# Patient Record
Sex: Male | Born: 1985 | Hispanic: No | Marital: Single
Health system: Southern US, Community
[De-identification: ages and names within clinical notes are randomized; demographics above are authoritative.]

---
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
## 2006-12-01 ENCOUNTER — Ambulatory Visit: Payer: Self-pay | Admitting: Family Medicine

## 2008-01-09 IMAGING — US US PELVIS LIMITED
1 series · 17 of 25 positions shown · non-contrast
Comparison: none

REASON FOR EXAM: Left Nodule
COMMENTS:

[Series 1: us pelvis limited · 17 of 62 slices shown]
[im 1/62]
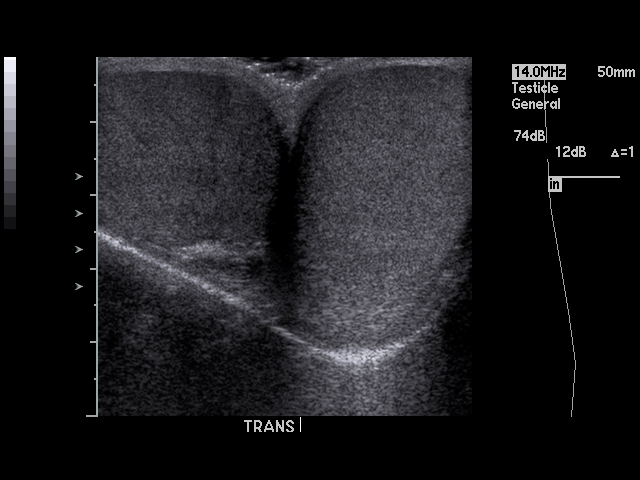
[im 6/62]
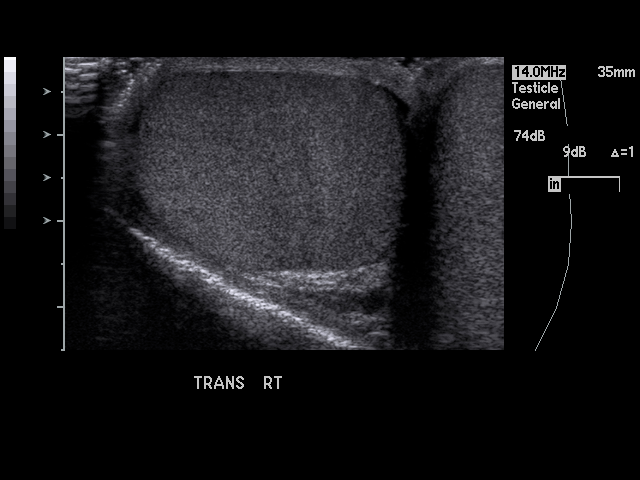
[im 8/62]
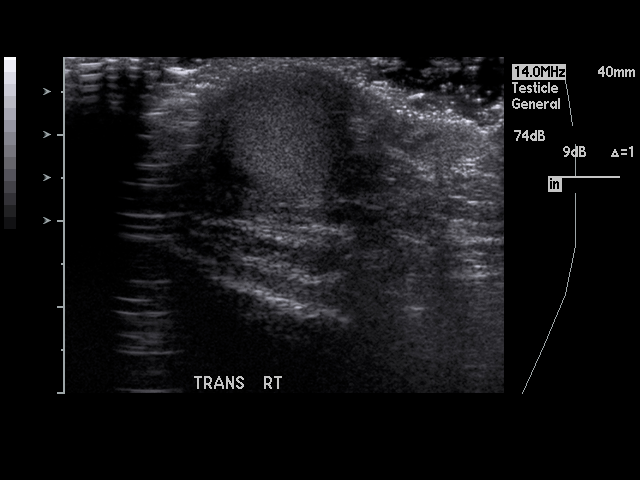
[im 13/62]
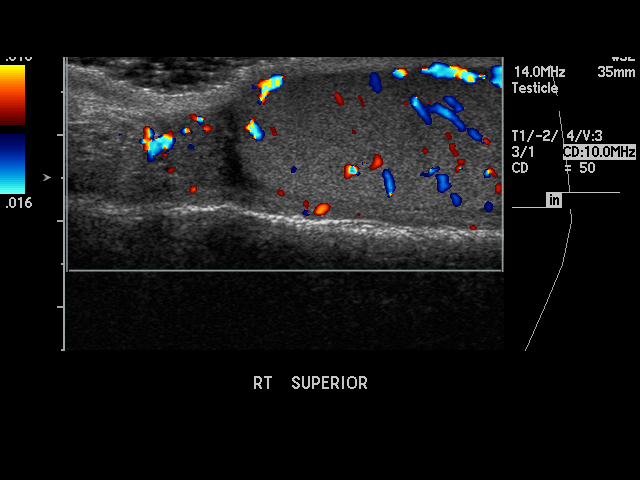
[im 16/62]
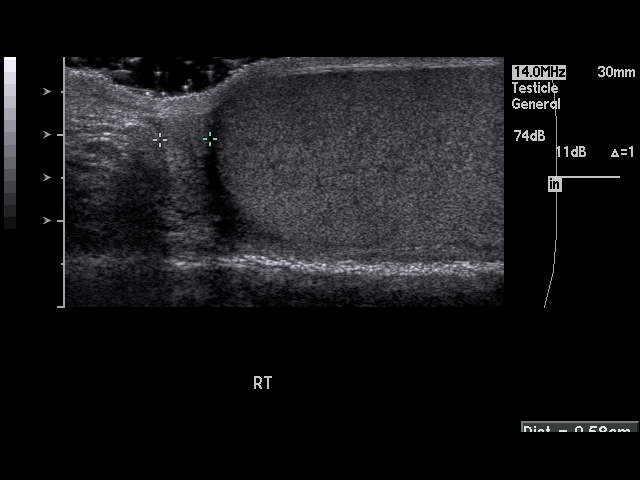
[im 21/62]
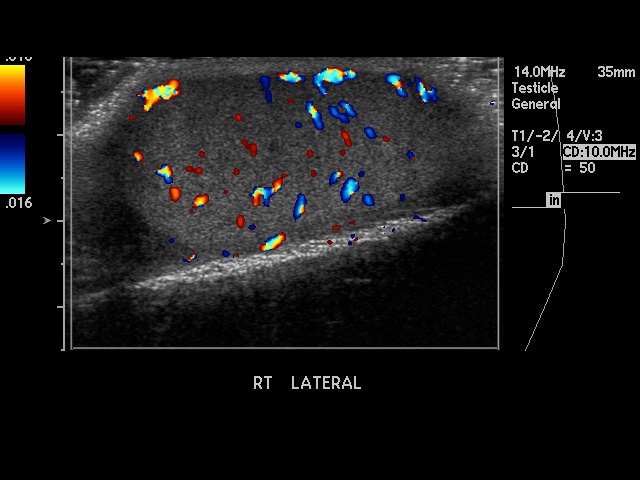
[im 23/62]
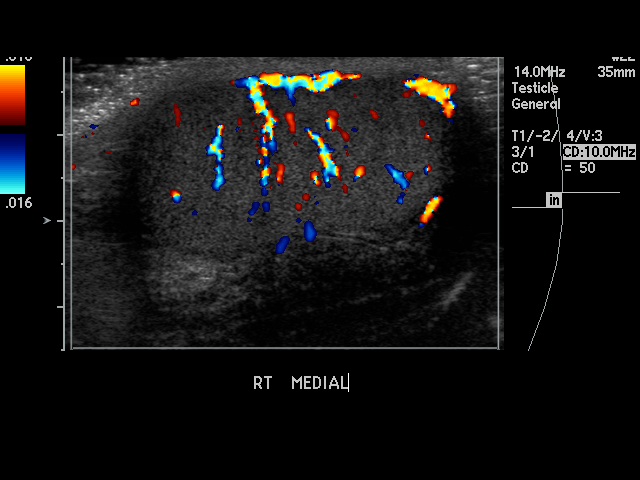
[im 28/62]
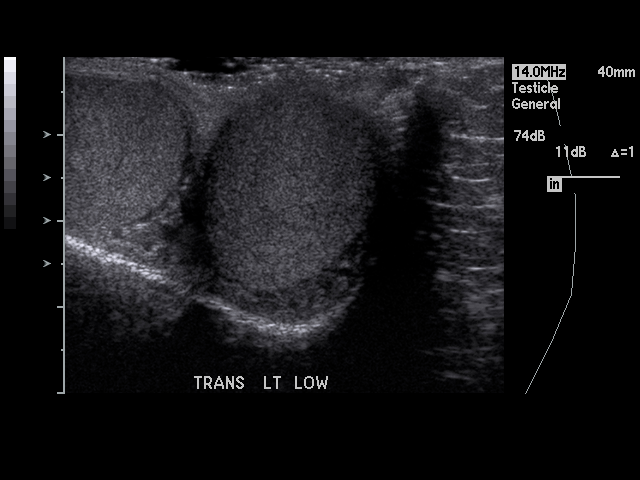
[im 31/62]
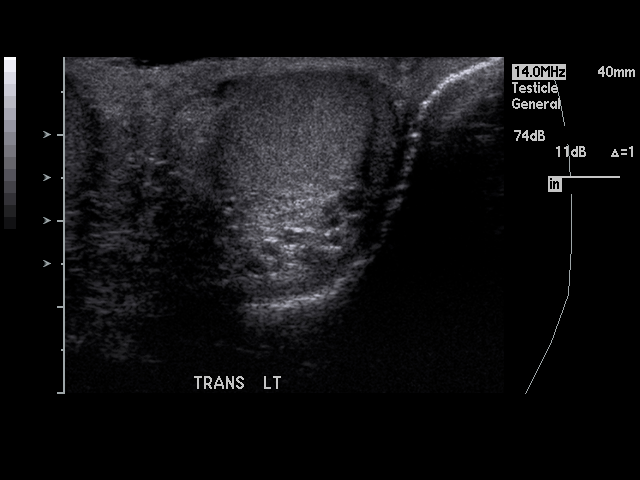
[im 34/62]
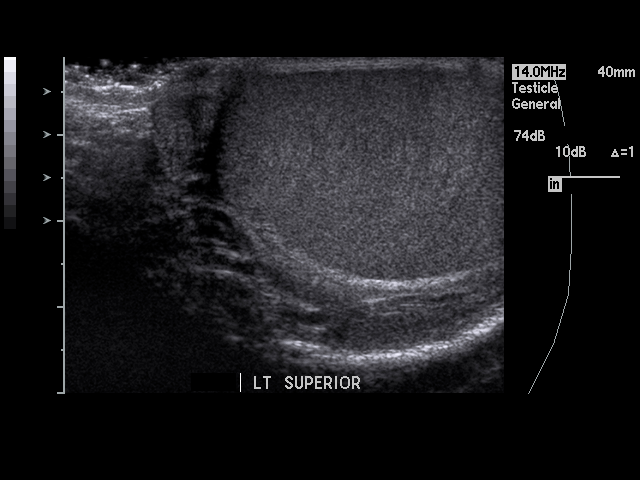
[im 39/62]
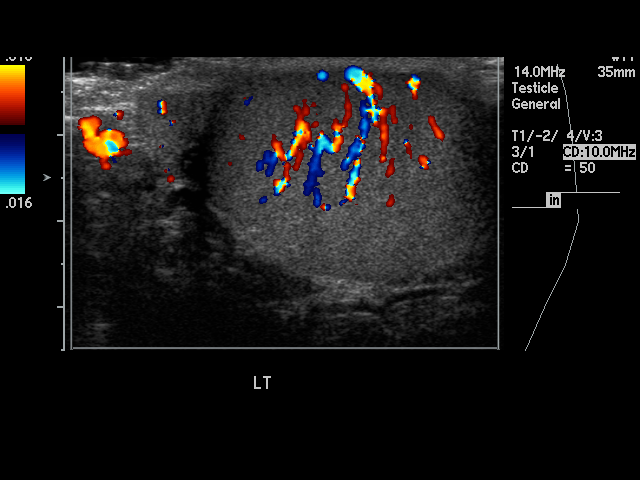
[im 41/62]
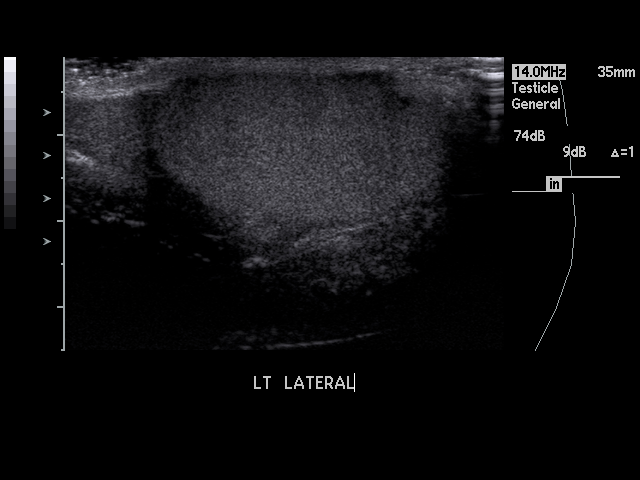
[im 46/62]
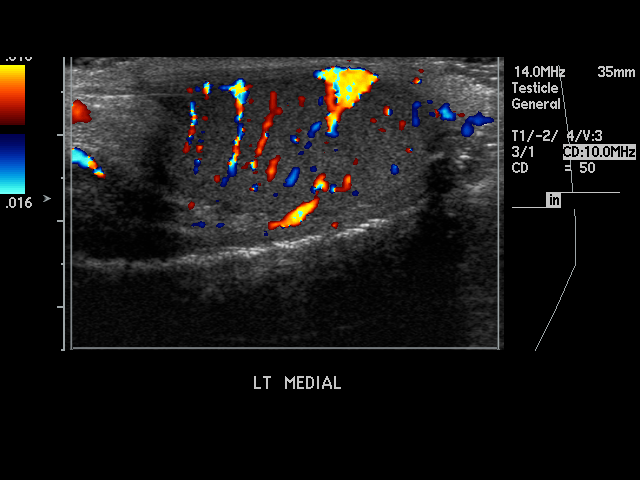
[im 49/62]
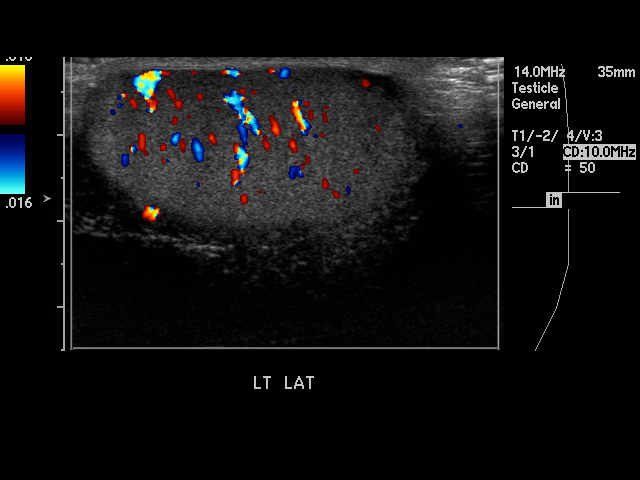
[im 54/62]
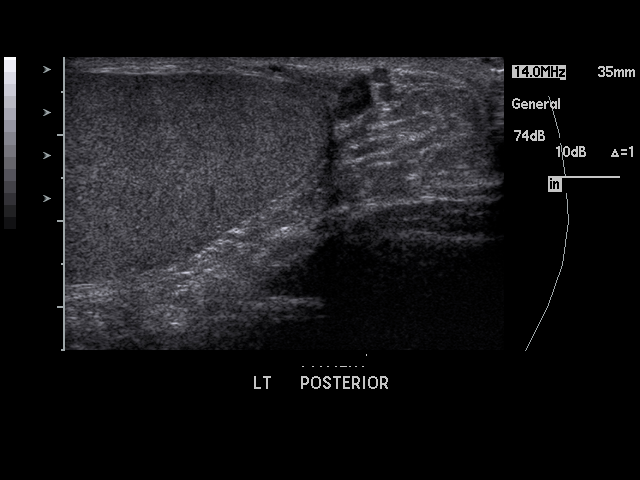
[im 56/62]
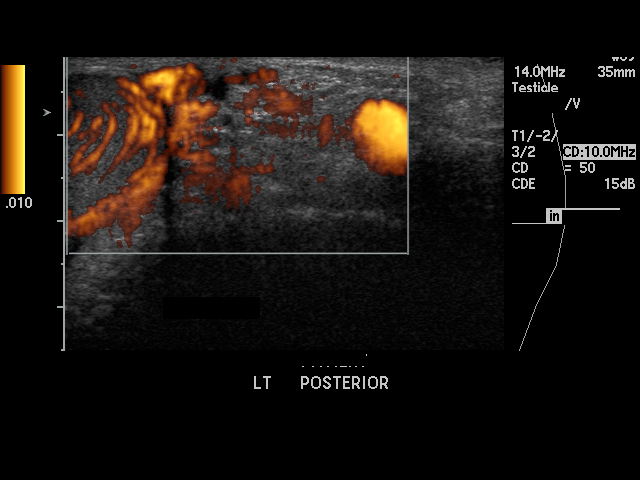
[im 62/62]
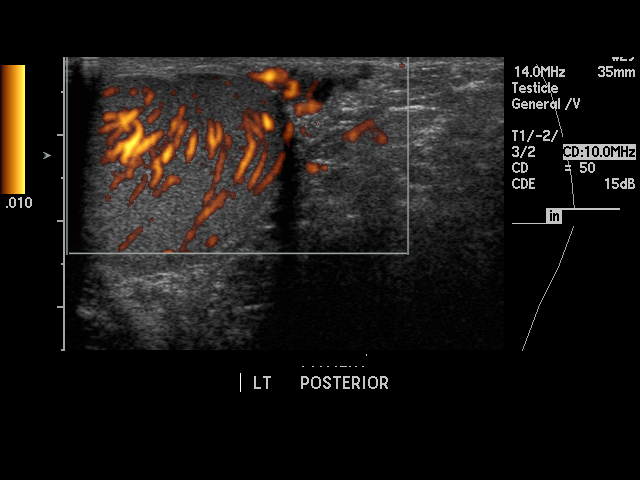

[17 of 25 positions shown; findings below may reference images not displayed]

PROCEDURE:     US  - US TESTICULAR  - December 01, 2006  [DATE]

RESULT:     The RIGHT testicle measures 5.16 cm x 2.40 cm x 3.0 cm. The LEFT
testicle measures 4.76 cm x 2.62 cm x 2.63 cm. No solid testicular mass
lesions are seen. There is noted a 2.9 mm cyst posteriorly in the LEFT
testicle. Doppler examination shows symmetrical vascular flow in each
testicle. The epididymis bilaterally is normal in appearance. No hydrocele
formation is observed.
IMPRESSION: No significant abnormalities are noted.

## 2012-10-08 ENCOUNTER — Ambulatory Visit: Payer: Self-pay | Admitting: Otolaryngology

## 2019-07-29 ENCOUNTER — Ambulatory Visit: Payer: Self-pay | Attending: Internal Medicine

## 2019-07-29 DIAGNOSIS — Z23 Encounter for immunization: Secondary | ICD-10-CM

## 2019-07-29 NOTE — Progress Notes (Signed)
   Covid-19 Vaccination [facility]  Name:  Raymond Price    MRN: [id number] DOB: 1985-08-11  07/29/2019  Mr. Troop was observed post Covid-19 immunization for 15 minutes without incident. He was provided with Vaccine Information Sheet and instruction to access the V-Safe system.   Mr. Tsang was instructed to call 911 with any severe reactions post vaccine: Marland Kitchen Difficulty breathing  . Swelling of face and throat  . A fast heartbeat  . A bad rash all over body  . Dizziness and weakness   Immunizations Administered    Name Date Dose VIS Date Route   Pfizer COVID-19 Vaccine 07/29/2019  2:11 PM 0.3 mL 04/23/2019 Intramuscular   Manufacturer: [employer], [employer]   Lot: [id number]   NDC: [address]-2

## 2019-08-23 ENCOUNTER — Ambulatory Visit: Payer: Medicaid Other | Attending: Internal Medicine

## 2019-08-23 DIAGNOSIS — Z23 Encounter for immunization: Secondary | ICD-10-CM

## 2019-08-23 NOTE — Progress Notes (Signed)
   Covid-19 Vaccination Clinic  Name:  [name]    MRN: [id number] DOB: 12-20-1985  08/23/2019  [name] was observed post Covid-19 immunization for 15 minutes without incident. He was provided with Vaccine Information Sheet and instruction to access the V-Safe system.   [name] was instructed to call 911 with any severe reactions post vaccine: Marland Kitchen Difficulty breathing  . Swelling of face and throat  . A fast heartbeat  . A bad rash all over body  . Dizziness and weakness   Immunizations Administered    Name Date Dose VIS Date Route   Pfizer COVID-19 Vaccine 08/23/2019  2:50 PM 0.3 mL 04/23/2019 Intramuscular   Manufacturer: [employer], [employer]   Lot: [id number]   NDC: [address]-2

## 2019-10-06 ENCOUNTER — Other Ambulatory Visit
Admission: RE | Admit: 2019-10-06 | Discharge: 2019-10-06 | Disposition: A | Discharge status: Home / Self Care | Payer: PRIVATE HEALTH INSURANCE | Source: Ambulatory Visit | Attending: Student | Admitting: Student

## 2019-10-06 DIAGNOSIS — M545 Low back pain: Secondary | ICD-10-CM | POA: Insufficient documentation

## 2019-10-06 DIAGNOSIS — R079 Chest pain, unspecified: Secondary | ICD-10-CM | POA: Insufficient documentation

## 2019-10-06 DIAGNOSIS — R0600 Dyspnea, unspecified: Secondary | ICD-10-CM | POA: Diagnosis present

## 2019-10-06 LAB — FIBRIN DERIVATIVES D-DIMER (ARMC ONLY): Fibrin derivatives D-dimer (ARMC): 116.09 ng/mL (FEU) (ref 0.00–499.00)

## 2021-05-23 ENCOUNTER — Other Ambulatory Visit: Payer: Self-pay | Admitting: Gastroenterology

## 2021-05-23 DIAGNOSIS — R1011 Right upper quadrant pain: Secondary | ICD-10-CM

## 2021-05-31 ENCOUNTER — Ambulatory Visit
Admission: RE | Admit: 2021-05-31 | Discharge: 2021-05-31 | Disposition: A | Discharge status: Home / Self Care | Payer: No Typology Code available for payment source | Source: Ambulatory Visit | Attending: Gastroenterology | Admitting: Gastroenterology

## 2021-05-31 ENCOUNTER — Other Ambulatory Visit: Payer: Self-pay

## 2021-05-31 DIAGNOSIS — R1011 Right upper quadrant pain: Secondary | ICD-10-CM | POA: Insufficient documentation

## 2021-06-14 ENCOUNTER — Other Ambulatory Visit: Payer: Self-pay | Admitting: Specialist

## 2021-06-14 DIAGNOSIS — R0609 Other forms of dyspnea: Secondary | ICD-10-CM

## 2021-06-14 DIAGNOSIS — R062 Wheezing: Secondary | ICD-10-CM

## 2021-06-26 ENCOUNTER — Ambulatory Visit
Admission: RE | Admit: 2021-06-26 | Discharge: 2021-06-26 | Disposition: A | Discharge status: Home / Self Care | Payer: No Typology Code available for payment source | Source: Ambulatory Visit | Attending: Specialist | Admitting: Specialist

## 2021-06-26 DIAGNOSIS — R062 Wheezing: Secondary | ICD-10-CM | POA: Insufficient documentation

## 2021-06-26 DIAGNOSIS — R0609 Other forms of dyspnea: Secondary | ICD-10-CM | POA: Diagnosis present

## 2021-07-02 ENCOUNTER — Other Ambulatory Visit: Payer: Self-pay | Admitting: Specialist

## 2021-07-02 ENCOUNTER — Other Ambulatory Visit (HOSPITAL_COMMUNITY): Payer: Self-pay | Admitting: Specialist

## 2021-07-02 DIAGNOSIS — R0609 Other forms of dyspnea: Secondary | ICD-10-CM

## 2022-06-13 ENCOUNTER — Ambulatory Visit: Payer: Medicaid Other

## 2022-06-18 ENCOUNTER — Ambulatory Visit
Admission: RE | Admit: 2022-06-18 | Discharge: 2022-06-18 | Disposition: A | Discharge status: Home / Self Care | Payer: Medicaid Other | Source: Ambulatory Visit | Attending: Specialist | Admitting: Specialist

## 2022-06-18 DIAGNOSIS — R0609 Other forms of dyspnea: Secondary | ICD-10-CM | POA: Diagnosis present

## 2022-07-09 IMAGING — US US ABDOMEN COMPLETE
1 series · 14 of 25 positions shown · non-contrast
Comparison: None.

CLINICAL DATA: Chronic right upper quadrant pain

EXAM:
ABDOMEN ULTRASOUND COMPLETE

[Series 1: us abdomen complete · 14 of 78 slices shown]
[im 1/78]
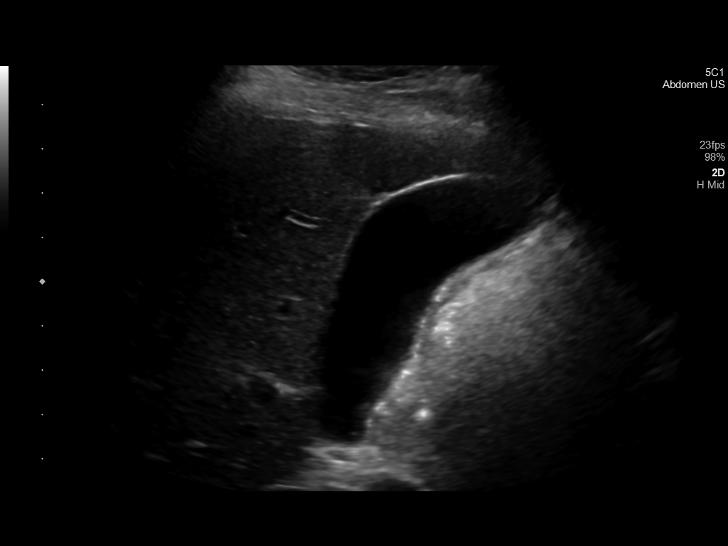
[im 7/78]
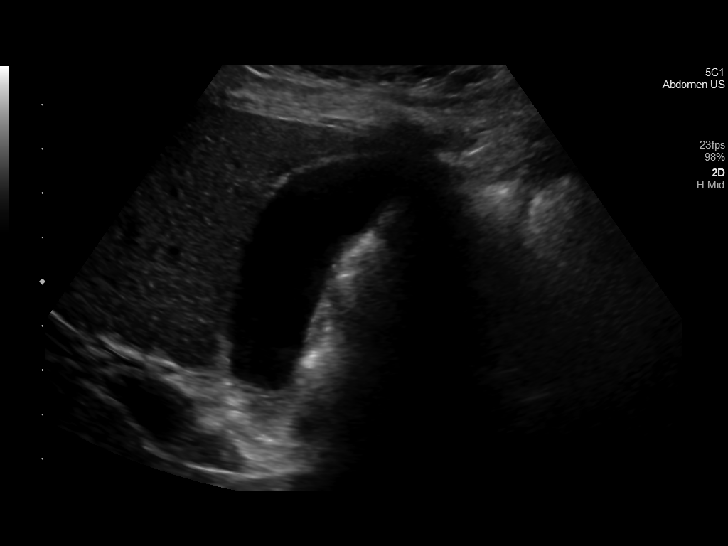
[im 13/78]
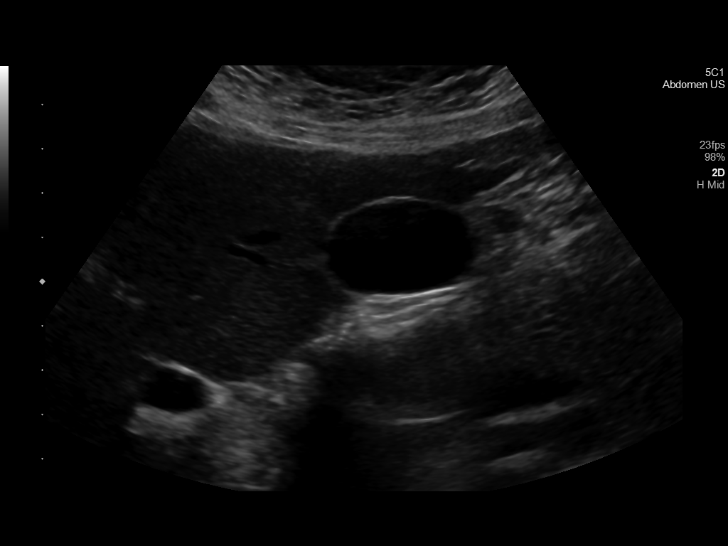
[im 20/78]
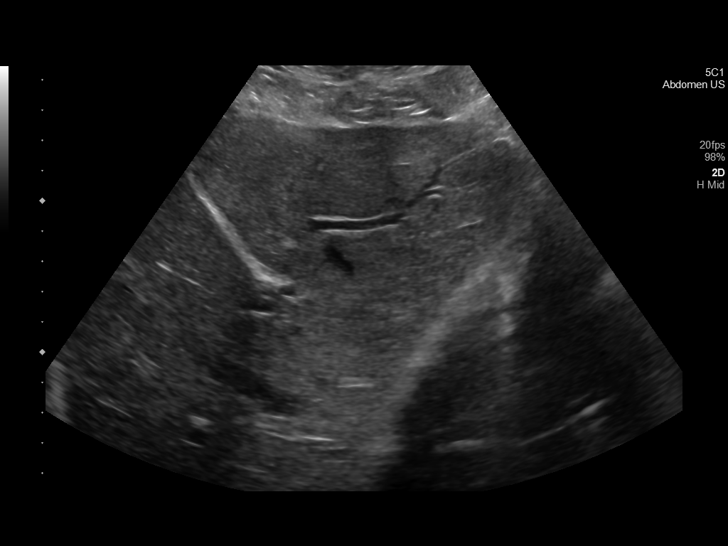
[im 26/78]
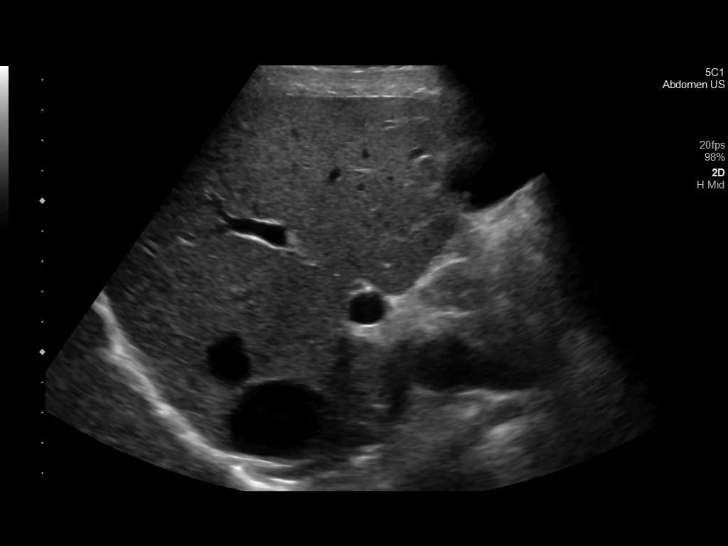
[im 29/78]
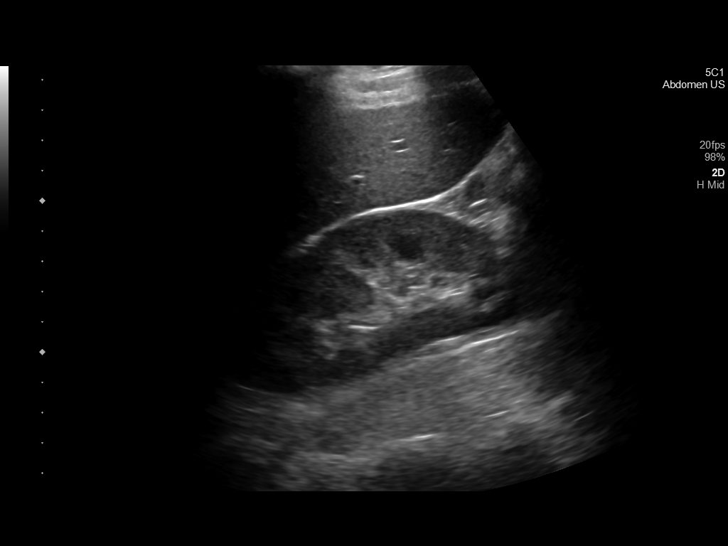
[im 36/78]
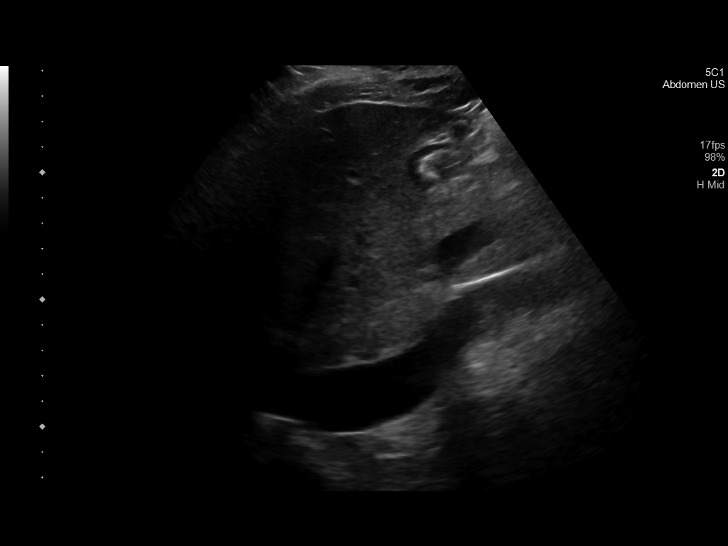
[im 42/78]
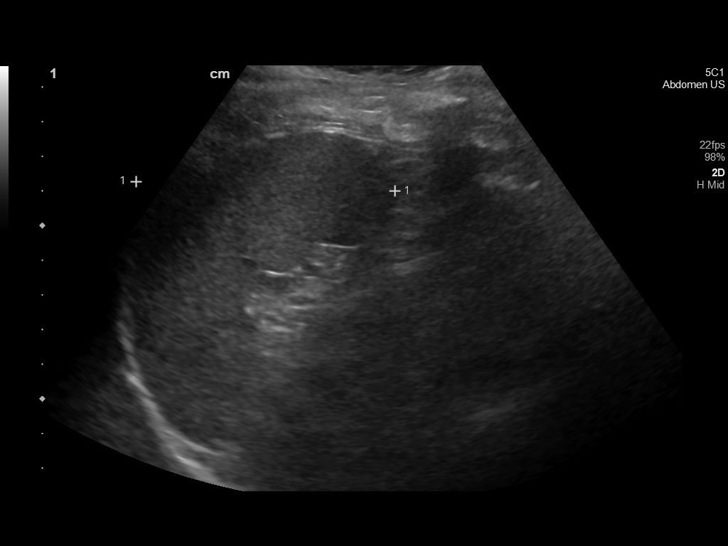
[im 49/78]
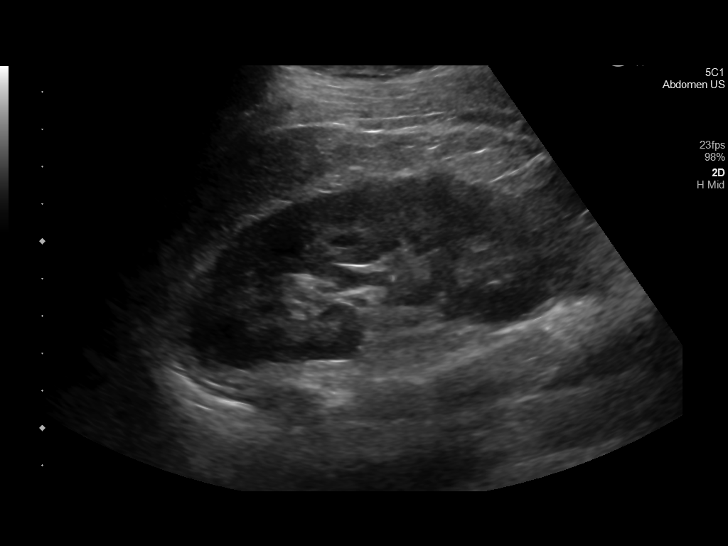
[im 52/78]
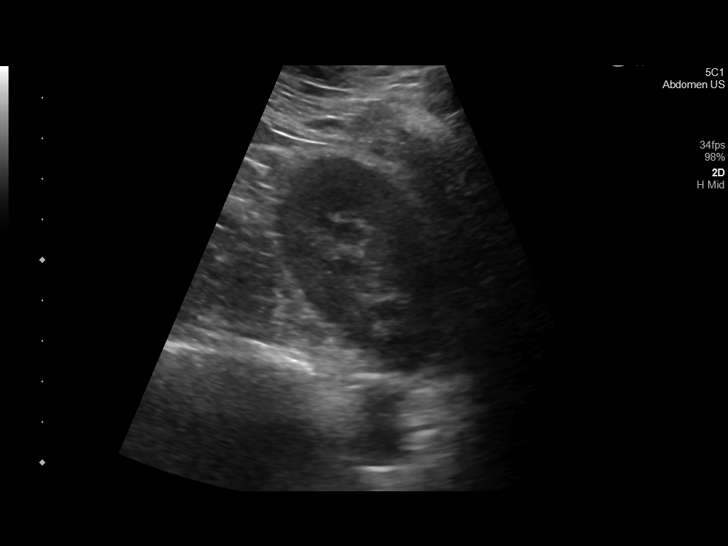
[im 58/78]
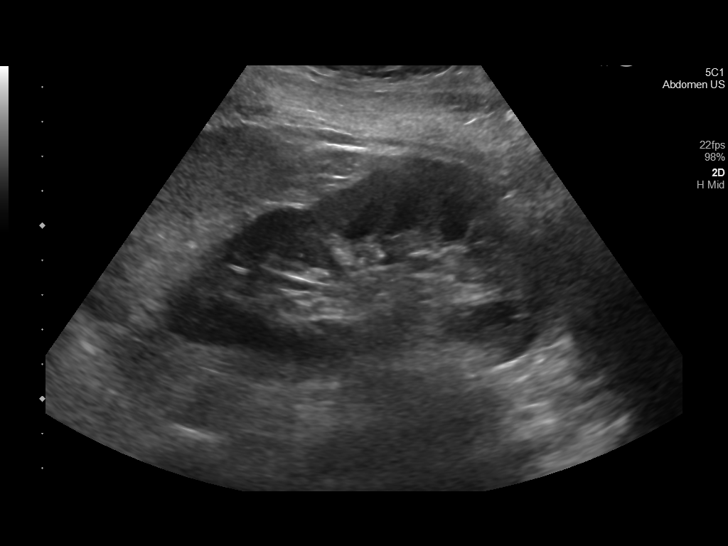
[im 65/78]
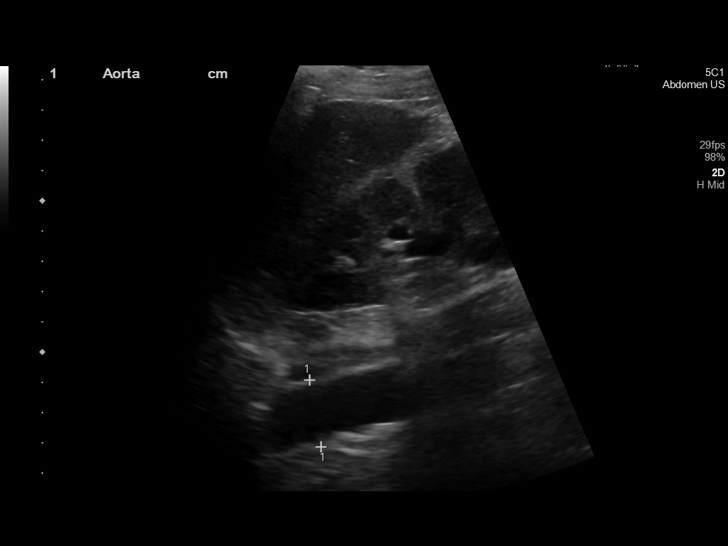
[im 71/78]
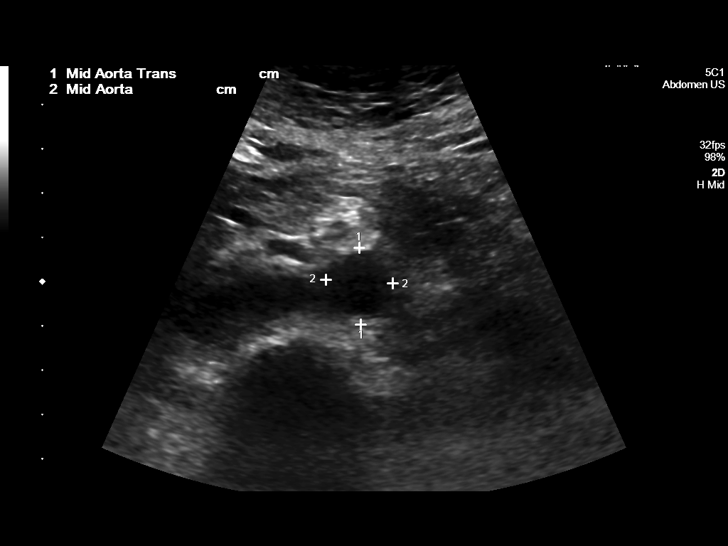
[im 78/78]
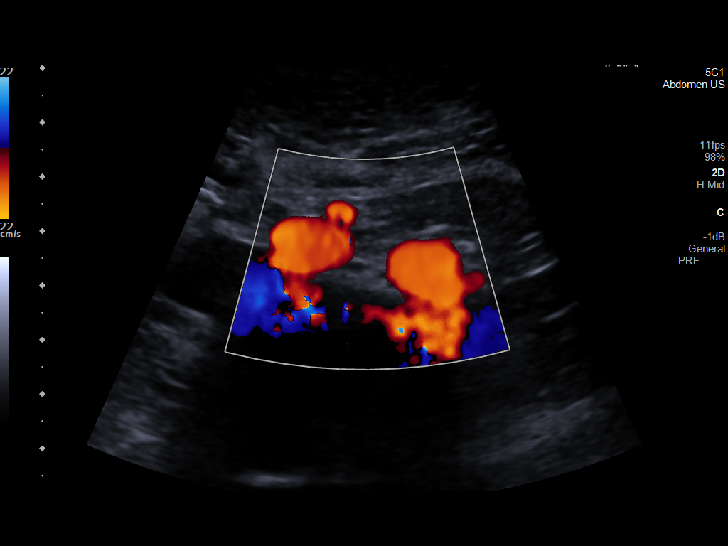

[14 of 25 positions shown; findings below may reference images not displayed]

FINDINGS: Gallbladder: No gallstones or wall thickening visualized. No
sonographic Murphy sign noted by sonographer.

Common bile duct: Diameter: 2 mm

Liver: No focal lesion identified. Within normal limits in
parenchymal echogenicity. Portal vein is patent on color Doppler
imaging with normal direction of blood flow towards the liver.

IVC: No abnormality visualized.

Pancreas: Pancreas is mostly obscured by bowel gas.

Spleen: Size and appearance within normal limits.

Right Kidney: Length: 12.4 cm. Echogenicity within normal limits. No
mass or hydronephrosis visualized.

Left Kidney: Length: 10.7 cm. Echogenicity within normal limits. No
mass or hydronephrosis visualized.

Abdominal aorta: No aneurysm visualized.

Other findings: None.
IMPRESSION: No significant sonographic abnormality is seen in the abdomen with
attention to right upper quadrant.

## 2022-08-04 IMAGING — CT CT CHEST W/O CM
2 of 4 series · 15 of 36 positions shown, 18 images · non-contrast
Comparison: 02/08/2021 chest radiograph report. Prior images not
available.

CLINICAL DATA: Right-sided shoulder, clavicular, and chest pain.
Smoker. No history of cancer or prior surgery.



[Series 2: thorax · axial · 0.75mm/px · z∈[-366,-42]mm · 12 of 192 slices shown, 15 images]
[im 15/192  mediastinal]
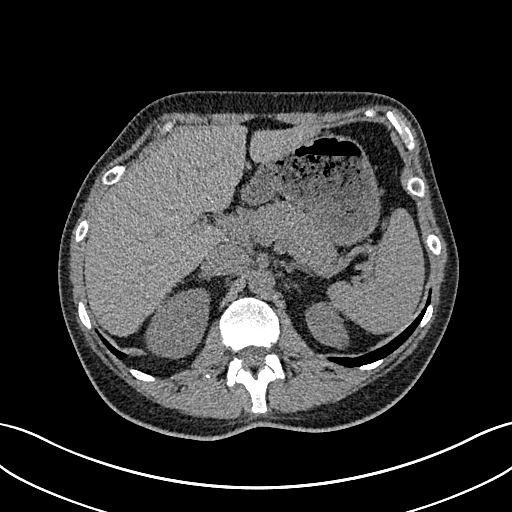
[im 15/192  lung]
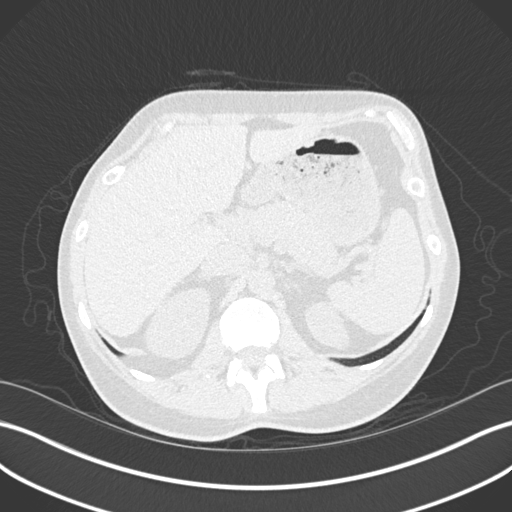
[im 30/192  lung]
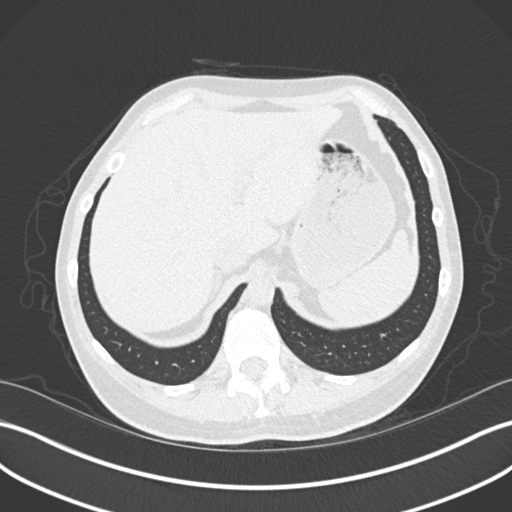
[im 45/192  lung]
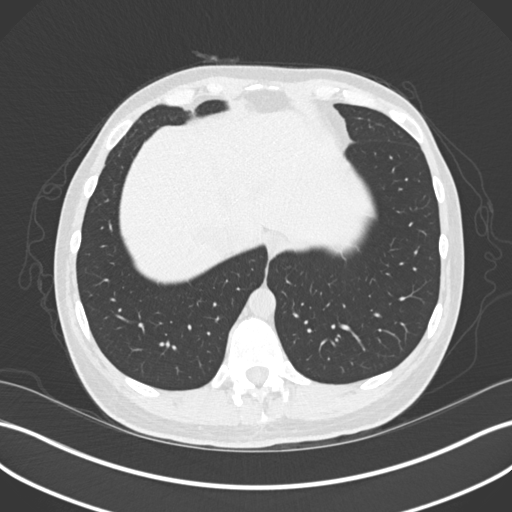
[im 59/192  lung]
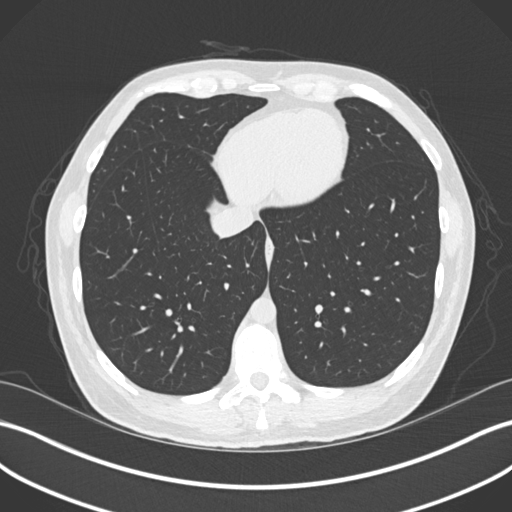
[im 74/192  mediastinal]
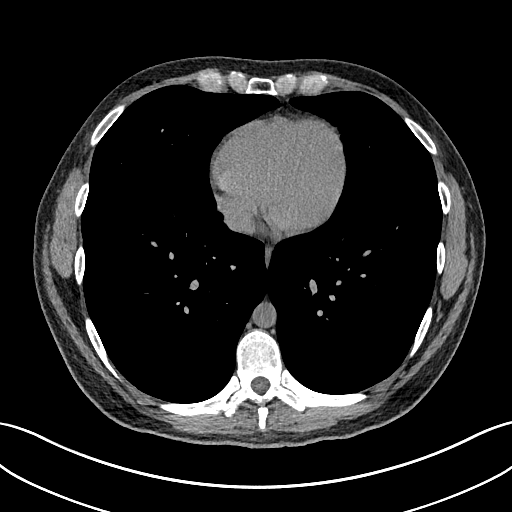
[im 74/192  lung]
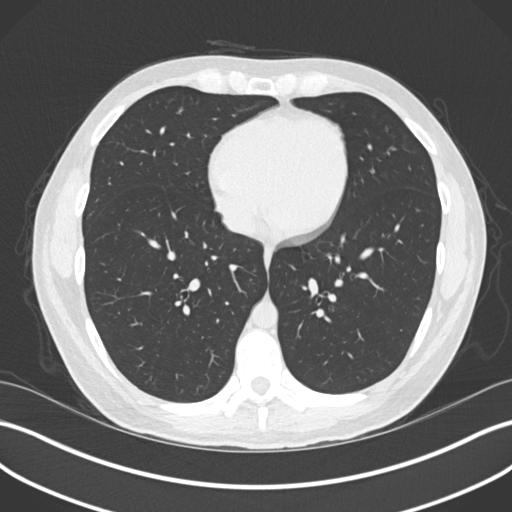
[im 89/192  lung]
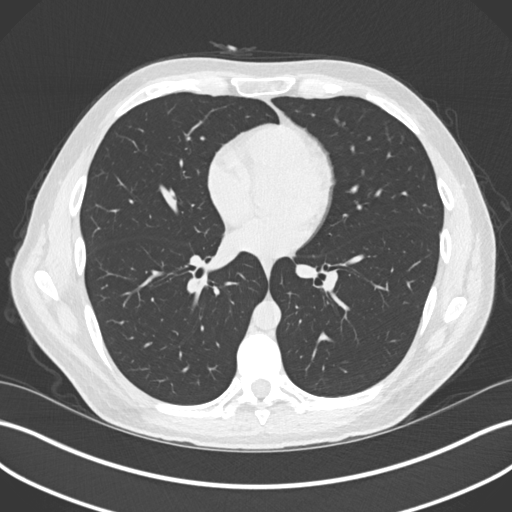
[im 103/192  lung]
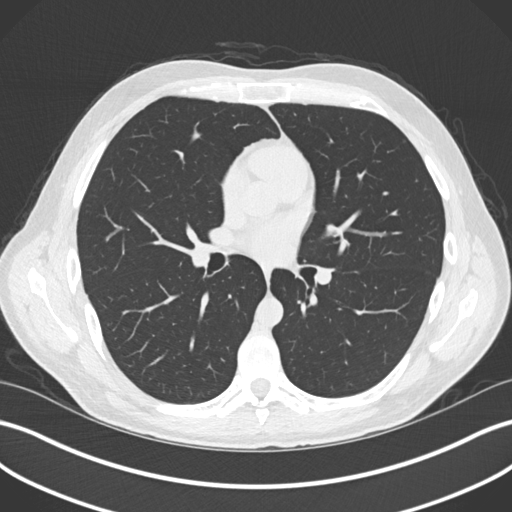
[im 118/192  lung]
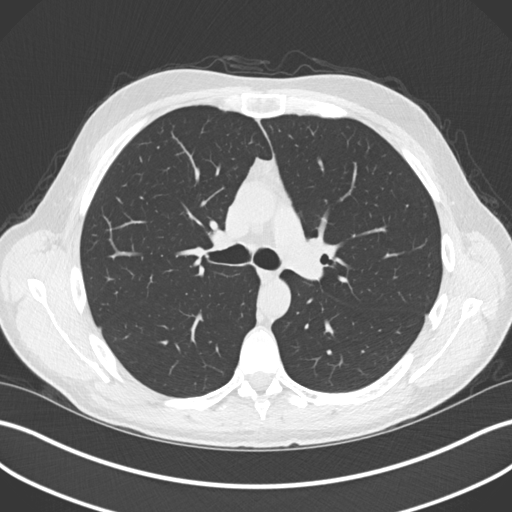
[im 133/192  mediastinal]
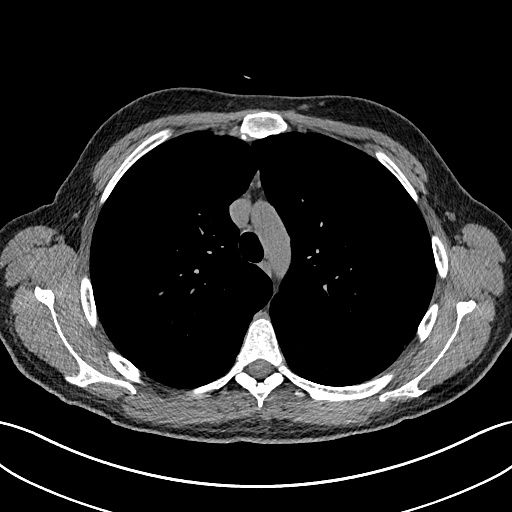
[im 133/192  lung]
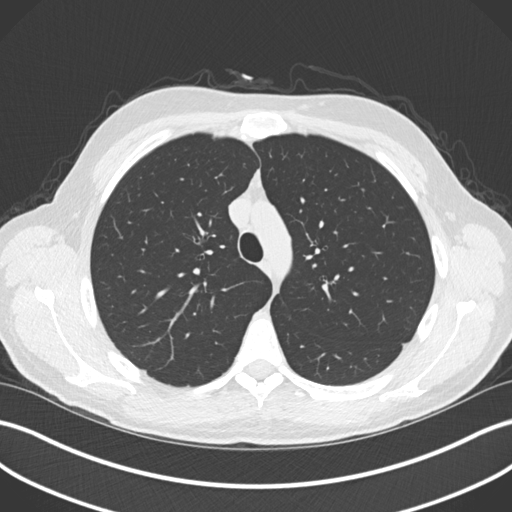
[im 147/192  lung]
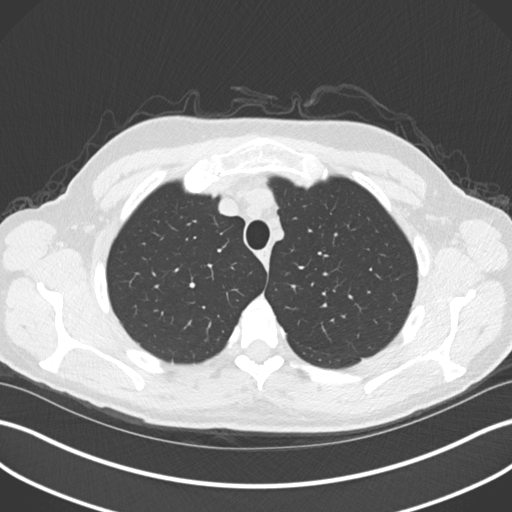
[im 162/192  lung]
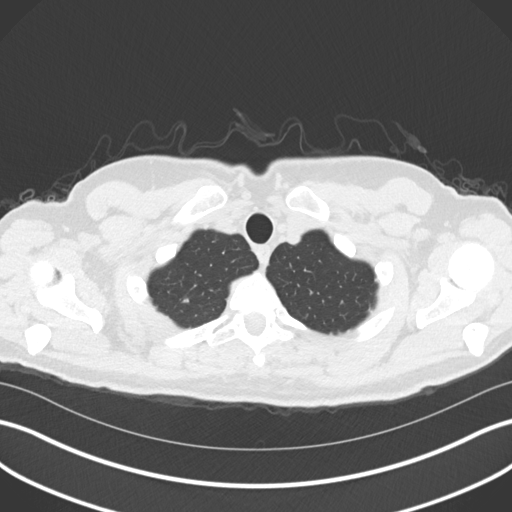
[im 177/192  lung]
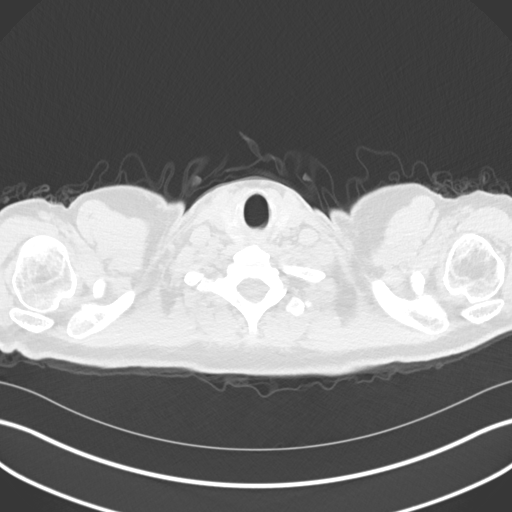

[Series 3: coronal · coronal · 0.76mm/px · 3 of 149 slices shown]
[im 30/149  lung]
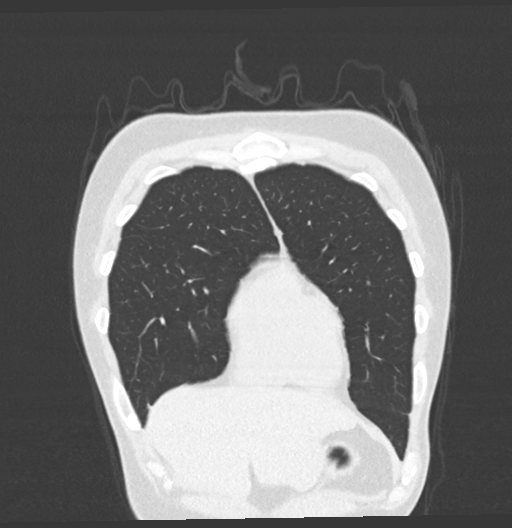
[im 60/149  lung]
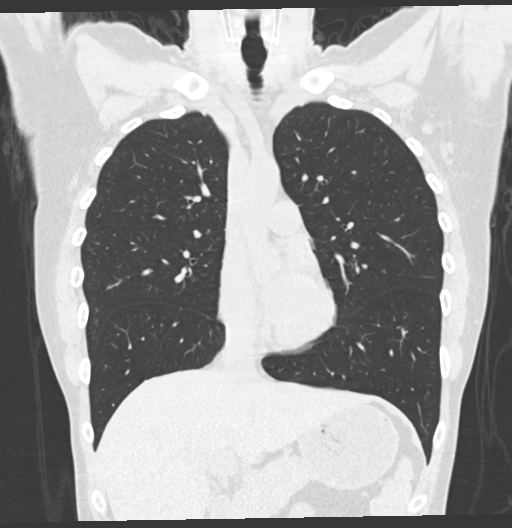
[im 89/149  lung]
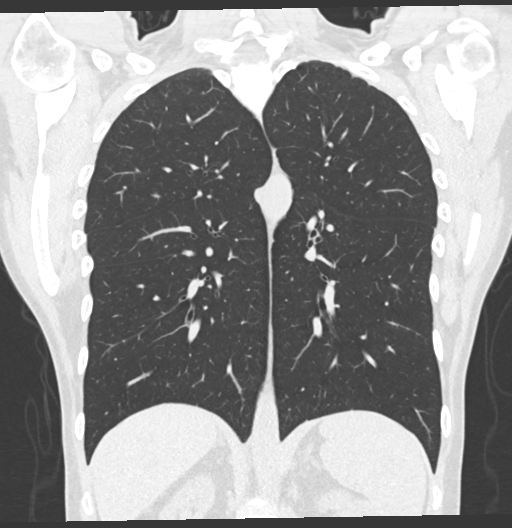

[15 of 36 positions shown; findings below may reference images not displayed]

FINDINGS: Cardiovascular: Normal caliber of the aorta and branch vessels.
Normal heart size, without pericardial effusion.

Mediastinum/Nodes: No mediastinal or definite hilar adenopathy,
given limitations of unenhanced CT.

Lungs/Pleura: No pleural fluid. Minimal biapical pleuroparenchymal
scarring.

Right apical nodular density of 5 mm is possibly related to the
underlying pleuroparenchymal scarring.

A left lower lobe 2 mm nodule on 161/5.

Upper Abdomen: Normal imaged portions of the liver, spleen, stomach,
pancreas, adrenal glands, kidneys.

Musculoskeletal: No acute osseous abnormality.
IMPRESSION: 1.  No acute process or explanation for right-sided pain.
2. Tiny left lower and possible right apical pulmonary nodules.
Fleischner criteria do not technically apply in patients under age
35. If patient is at high clinical risk of primary bronchogenic
carcinoma, chest CT follow-up at 1 year could be performed.

## 2023-06-13 ENCOUNTER — Other Ambulatory Visit: Payer: Self-pay | Admitting: Specialist

## 2023-06-13 DIAGNOSIS — R918 Other nonspecific abnormal finding of lung field: Secondary | ICD-10-CM

## 2023-07-02 ENCOUNTER — Ambulatory Visit
Admission: RE | Admit: 2023-07-02 | Discharge: 2023-07-02 | Disposition: A | Discharge status: Home / Self Care | Payer: Medicaid Other | Source: Ambulatory Visit | Attending: Specialist | Admitting: Specialist

## 2023-07-02 DIAGNOSIS — R918 Other nonspecific abnormal finding of lung field: Secondary | ICD-10-CM | POA: Insufficient documentation
